# Patient Record
Sex: Male | Born: 1975 | Race: White | Hispanic: No | Marital: Married | Smoking: Never smoker
Health system: Southern US, Community
[De-identification: ages and names within clinical notes are randomized; demographics above are authoritative.]

---

## 2020-04-29 DIAGNOSIS — Z0000 Encounter for general adult medical examination without abnormal findings: Secondary | ICD-10-CM | POA: Diagnosis not present

## 2020-04-29 DIAGNOSIS — Z1329 Encounter for screening for other suspected endocrine disorder: Secondary | ICD-10-CM | POA: Diagnosis not present

## 2020-04-29 DIAGNOSIS — Z13220 Encounter for screening for lipoid disorders: Secondary | ICD-10-CM | POA: Diagnosis not present

## 2020-05-05 DIAGNOSIS — Z0000 Encounter for general adult medical examination without abnormal findings: Secondary | ICD-10-CM | POA: Diagnosis not present

## 2020-05-06 DIAGNOSIS — D3131 Benign neoplasm of right choroid: Secondary | ICD-10-CM | POA: Diagnosis not present

## 2020-05-13 DIAGNOSIS — B078 Other viral warts: Secondary | ICD-10-CM | POA: Diagnosis not present

## 2020-09-10 DIAGNOSIS — D3131 Benign neoplasm of right choroid: Secondary | ICD-10-CM | POA: Diagnosis not present

## 2020-09-30 DIAGNOSIS — Z20822 Contact with and (suspected) exposure to covid-19: Secondary | ICD-10-CM | POA: Diagnosis not present

## 2021-01-03 ENCOUNTER — Other Ambulatory Visit: Payer: Self-pay

## 2021-01-03 ENCOUNTER — Encounter (HOSPITAL_BASED_OUTPATIENT_CLINIC_OR_DEPARTMENT_OTHER): Payer: Self-pay | Admitting: Obstetrics and Gynecology

## 2021-01-03 ENCOUNTER — Emergency Department (HOSPITAL_BASED_OUTPATIENT_CLINIC_OR_DEPARTMENT_OTHER): Payer: BC Managed Care – PPO

## 2021-01-03 ENCOUNTER — Emergency Department (HOSPITAL_BASED_OUTPATIENT_CLINIC_OR_DEPARTMENT_OTHER)
Admission: EM | Admit: 2021-01-03 | Discharge: 2021-01-03 | Disposition: A | Discharge status: Home / Self Care | Payer: BC Managed Care – PPO | Attending: Emergency Medicine | Admitting: Emergency Medicine

## 2021-01-03 DIAGNOSIS — W1839XA Other fall on same level, initial encounter: Secondary | ICD-10-CM | POA: Diagnosis not present

## 2021-01-03 DIAGNOSIS — Y9366 Activity, soccer: Secondary | ICD-10-CM | POA: Diagnosis not present

## 2021-01-03 DIAGNOSIS — S6992XA Unspecified injury of left wrist, hand and finger(s), initial encounter: Secondary | ICD-10-CM | POA: Diagnosis not present

## 2021-01-03 DIAGNOSIS — M79645 Pain in left finger(s): Secondary | ICD-10-CM

## 2021-01-03 NOTE — ED Notes (Signed)
Patient verbalizes understanding of discharge instructions. Opportunity for questioning and answers were provided. Armband removed by staff, pt discharged from ED.  

## 2021-01-03 NOTE — Discharge Instructions (Addendum)
Take 4 over the counter ibuprofen tablets 3 times a day or 2 over-the-counter naproxen tablets twice a day for pain. Also take tylenol 1000mg (2 extra strength) four times a day.   Your x-ray was negative for fracture.  Use the splint for comfort.  Follow-up with orthopedics in the office.

## 2021-01-03 NOTE — ED Provider Notes (Signed)
[facility] EMERGENCY DEPT Provider Note   CSN: [id number] Arrival date & time: 01/03/21  1837     History Chief Complaint  Patient presents with  . Finger Injury    James Nash is a 45 y.o. male.  45 yo M with a chief complaints of left thumb injury.  The patient was playing soccer and he fell onto an outstretched hand and felt like he hyperextended his thumb.  This occurred about 3 hours ago.  Having some worsening pain and swelling mostly to the thenar eminence.  Denies other injury denies pain in the wrist or the elbow.  The history is provided by the patient.  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 No pertinent past medical history.  There are no problems to display for this patient.   History reviewed. No pertinent surgical history.     History reviewed. No pertinent family history.  Social History   Tobacco Use  . Smoking status: Never Smoker  . Smokeless tobacco: Never Used  Vaping Use  . Vaping Use: Never used  Substance Use Topics  . Alcohol use: Yes  . Drug use: Not Currently    Home Medications Prior to Admission medications   Not on File    Allergies    Patient has no allergy information on record.  Review of Systems   Review of Systems  Constitutional: Negative for chills and fever.   HENT: Negative for congestion and facial swelling.   Eyes: Negative for discharge and visual disturbance.  Respiratory: Negative for shortness of breath.   Cardiovascular: Negative for chest pain and palpitations.  Gastrointestinal: Negative for abdominal pain, diarrhea and vomiting.  Musculoskeletal: Positive for arthralgias and myalgias.  Skin: Negative for color change and rash.  Neurological: Negative for tremors, syncope and headaches.  Psychiatric/Behavioral: Negative for confusion and dysphoric mood.    Physical Exam Updated Vital Signs BP 140/87 (BP Location: Right Arm)   Pulse 89   Temp 98.9 F (37.2 C) (Oral)   Resp 15   Ht 5\' 11"  (1.803 m)   Wt 88.5 kg   SpO2 100%   BMI 27.20 kg/m   Physical Exam Vitals and nursing note reviewed.  Constitutional:      Appearance: He is well-developed.  HENT:     Head: Normocephalic and atraumatic.  Eyes:     Pupils: Pupils are equal, round, and reactive to light.  Neck:     Vascular: No JVD.  Cardiovascular:     Rate and Rhythm: Normal rate and regular rhythm.     Heart sounds: No murmur heard. No friction rub. No gallop.   Pulmonary:     Effort: No respiratory distress.     Breath  sounds: No wheezing.  Abdominal:     General: There is no distension.     Tenderness: There is no guarding or rebound.  Musculoskeletal:        General: Swelling and tenderness present. Normal range of motion.     Cervical back: Normal range of motion and neck supple.     Comments: Pain and swelling to the thenar eminence of the left hand.  Pain at the MCP.  Some ligamentous laxity compared to the other side.  Skin:    Coloration: Skin is not pale.     Findings: No rash.  Neurological:     Mental Status: He is alert and oriented to person, place, and time.  Psychiatric:        Behavior: Behavior normal.     ED Results / Procedures / Treatments   Labs (all labs ordered are listed, but only abnormal results are displayed) Labs  Reviewed - No data to display  EKG None  Radiology DG Finger Thumb Left  Result Date: 01/03/2021 CLINICAL DATA:  Acute LEFT thumb pain following injury today. Initial encounter. EXAM: LEFT THUMB 2+V COMPARISON:  None. FINDINGS: There is no evidence of fracture or dislocation. There is no evidence of arthropathy or other focal bone abnormality. Soft tissues are unremarkable. IMPRESSION: Negative. Electronically Signed   By: Harmon Pier M.D.   On: 01/03/2021 20:09    Procedures Procedures   Medications Ordered in ED Medications - No data to display  ED Course  I have reviewed the triage vital signs and the nursing notes.  Pertinent labs & imaging results that were available during my care of the patient were reviewed by me and considered in my medical decision making (see chart for details).    MDM Rules/Calculators/A&P                          45 yo M with a chief complaints of a thumb injury.  Patient had a hyperextension injury after falling while playing soccer.  On exam the patient does have some ligamentous injury making me somewhat concerned for a gamekeeper's thumb.  Will place in a thumb spica.  Plain film viewed by me without fracture.  He is also incidentally febrile here.  Denies any significant symptoms.  10:03 PM:  I have discussed the diagnosis/risks/treatment options with the patient and believe the pt to be eligible for discharge home to follow-up with Hand. We also discussed returning to the ED immediately if new or worsening sx occur. We discussed the sx which are most concerning (e.g., sudden worsening pain, fever, inability to tolerate by mouth) that necessitate immediate return. Medications administered to the patient during their visit and any new prescriptions provided to the patient are listed below.  Medications given during this visit Medications - No data to display   The patient appears reasonably screen and/or stabilized for discharge and I doubt any other  medical condition or other [facility] requiring further screening, evaluation, or treatment in the ED at this time prior to discharge.    Final Clinical Impression(s) / ED Diagnoses Final diagnoses:  Thumb pain, left    Rx / DC Orders ED Discharge Orders    None       Melene Plan, DO 01/03/21 2203

## 2021-01-03 NOTE — ED Triage Notes (Signed)
Patient reports he fell back on his left hand while playing soccer. Reports pain and injury to left thumb

## 2021-05-06 DIAGNOSIS — Z0000 Encounter for general adult medical examination without abnormal findings: Secondary | ICD-10-CM | POA: Diagnosis not present

## 2021-05-13 DIAGNOSIS — E7800 Pure hypercholesterolemia, unspecified: Secondary | ICD-10-CM | POA: Diagnosis not present

## 2021-05-13 DIAGNOSIS — Z23 Encounter for immunization: Secondary | ICD-10-CM | POA: Diagnosis not present

## 2021-05-13 DIAGNOSIS — D229 Melanocytic nevi, unspecified: Secondary | ICD-10-CM | POA: Diagnosis not present

## 2021-05-13 DIAGNOSIS — Z0000 Encounter for general adult medical examination without abnormal findings: Secondary | ICD-10-CM | POA: Diagnosis not present

## 2021-05-14 ENCOUNTER — Other Ambulatory Visit: Payer: Self-pay | Admitting: Internal Medicine

## 2021-05-14 DIAGNOSIS — E7800 Pure hypercholesterolemia, unspecified: Secondary | ICD-10-CM

## 2021-06-11 ENCOUNTER — Ambulatory Visit
Admission: RE | Admit: 2021-06-11 | Discharge: 2021-06-11 | Disposition: A | Discharge status: Home / Self Care | Payer: No Typology Code available for payment source | Source: Ambulatory Visit | Attending: Internal Medicine | Admitting: Internal Medicine

## 2021-06-11 DIAGNOSIS — E785 Hyperlipidemia, unspecified: Secondary | ICD-10-CM | POA: Diagnosis not present

## 2021-06-11 DIAGNOSIS — E7800 Pure hypercholesterolemia, unspecified: Secondary | ICD-10-CM

## 2021-06-11 DIAGNOSIS — Z8249 Family history of ischemic heart disease and other diseases of the circulatory system: Secondary | ICD-10-CM | POA: Diagnosis not present

## 2021-07-01 DIAGNOSIS — E7800 Pure hypercholesterolemia, unspecified: Secondary | ICD-10-CM | POA: Diagnosis not present

## 2021-07-13 DIAGNOSIS — L219 Seborrheic dermatitis, unspecified: Secondary | ICD-10-CM | POA: Diagnosis not present

## 2021-07-13 DIAGNOSIS — Z23 Encounter for immunization: Secondary | ICD-10-CM | POA: Diagnosis not present

## 2021-07-13 DIAGNOSIS — D2272 Melanocytic nevi of left lower limb, including hip: Secondary | ICD-10-CM | POA: Diagnosis not present

## 2021-12-10 DIAGNOSIS — J029 Acute pharyngitis, unspecified: Secondary | ICD-10-CM | POA: Diagnosis not present

## 2022-01-20 DIAGNOSIS — D229 Melanocytic nevi, unspecified: Secondary | ICD-10-CM | POA: Diagnosis not present

## 2022-01-20 DIAGNOSIS — L578 Other skin changes due to chronic exposure to nonionizing radiation: Secondary | ICD-10-CM | POA: Diagnosis not present

## 2022-01-20 DIAGNOSIS — L814 Other melanin hyperpigmentation: Secondary | ICD-10-CM | POA: Diagnosis not present

## 2022-01-20 DIAGNOSIS — D1801 Hemangioma of skin and subcutaneous tissue: Secondary | ICD-10-CM | POA: Diagnosis not present

## 2022-01-26 DIAGNOSIS — R21 Rash and other nonspecific skin eruption: Secondary | ICD-10-CM | POA: Diagnosis not present

## 2022-05-12 DIAGNOSIS — Z0000 Encounter for general adult medical examination without abnormal findings: Secondary | ICD-10-CM | POA: Diagnosis not present

## 2022-05-16 DIAGNOSIS — Z1212 Encounter for screening for malignant neoplasm of rectum: Secondary | ICD-10-CM | POA: Diagnosis not present

## 2022-07-26 ENCOUNTER — Ambulatory Visit (INDEPENDENT_AMBULATORY_CARE_PROVIDER_SITE_OTHER): Payer: BC Managed Care – PPO | Admitting: Family Medicine

## 2022-07-26 ENCOUNTER — Ambulatory Visit (INDEPENDENT_AMBULATORY_CARE_PROVIDER_SITE_OTHER): Payer: BC Managed Care – PPO

## 2022-07-26 ENCOUNTER — Ambulatory Visit: Payer: Self-pay

## 2022-07-26 VITALS — BP 148/88 | HR 82 | Ht 71.0 in | Wt 205.0 lb

## 2022-07-26 DIAGNOSIS — M79672 Pain in left foot: Secondary | ICD-10-CM

## 2022-07-26 NOTE — Patient Instructions (Addendum)
Thank you for coming in today.   I think you have excessive pressure on your bunionette.   Consider wider shoes.   You can cut a slit in your soccer cleats.   Consider aftermarket insole with good arch support and metatarsal pad from [employer] feet.   Also consider the many products online to offload pressure from bunions or bunionettes.   Recheck in about 1 month.   Please get an Xray today before you leave

## 2022-07-26 NOTE — Progress Notes (Signed)
   I, Philbert Riser, LAT, ATC acting as a scribe for Clementeen Graham, MD.  Subjective:    CC: L foot pain  HPI: Pt is a 46 y/o male c/o L foot pain ongoing for 4-5 months. 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 Pt locates pain to along the 5th MT.   L foot swelling: no Aggravates: pressure against the area Treatments tried: new/wider soccer cleats, toe spacers  Pertinent review of Systems: No fevers or chills  Relevant historical information: Otherwise healthy   Objective:    Vitals:   07/26/22 1403  BP: (!) 148/88  Pulse: 82  SpO2: 98%   General: Well Developed, well nourished, and in no acute distress.   MSK: Left foot bunion and bunionette formation.  Some redness at the bunionette.  Mildly tender palpation at the distal lateral fifth metatarsal at bunionette.  Normal foot and ankle motion and strength.  Lab and Radiology Results  Diagnostic Limited MSK Ultrasound of: Left lateral foot The distal fifth metatarsal was visualized with ultrasound and the bony cortex was normal-appearing with no evidence of stress fracture. No significant hypoechoic fluid collections or other significant abnormalities are visualized. Impression: Normal-appearing MSK ultrasound examination of the distal fifth metatarsal   X-ray images left foot obtained today personally and independently interpreted No severe abnormality left foot.  Mild bunion and bunionette formation is present.  No severe DJD or acute fractures visible. Await formal radiology review  Impression and Recommendations:    Assessment and Plan: 46 y.o. male with excessive pressure at bunionette causing pain.  Recommend pressure offloading techniques.  Reviewed a couple options cleaning different or new shoes.  Lacing pattern, bunionette shields.  Also recommend supportive insoles with metatarsal pads.  Check back in about a month.Marland Kitchen  PDMP not reviewed this encounter. Orders Placed This  Encounter  Procedures   DG Foot Complete Left    Standing Status:   Future    Number of Occurrences:   1    Standing Expiration Date:   07/27/2023    Order Specific Question:   Reason for Exam (SYMPTOM  OR DIAGNOSIS REQUIRED)    Answer:   left foot pain    Order Specific Question:   Preferred imaging location?    Answer:   [facility]   [place] LIMITED JOINT SPACE STRUCTURES LOW LEFT(NO LINKED CHARGES)    Order Specific Question:   Reason for Exam (SYMPTOM  OR DIAGNOSIS REQUIRED)    Answer:   left foot pain    Order Specific Question:   Preferred imaging location?    Answer:   [facility]   No orders of the defined types were placed in this encounter.   Discussed warning signs or symptoms. Please see discharge instructions. Patient expresses understanding.   The above documentation has been reviewed and is accurate and complete Clementeen Graham, M.D.

## 2022-07-27 NOTE — Progress Notes (Signed)
Left foot x-ray shows a little bit of ankle arthritis

## 2022-08-23 DIAGNOSIS — Z0110 Encounter for examination of ears and hearing without abnormal findings: Secondary | ICD-10-CM | POA: Diagnosis not present

## 2022-08-23 DIAGNOSIS — H93293 Other abnormal auditory perceptions, bilateral: Secondary | ICD-10-CM | POA: Diagnosis not present

## 2022-08-26 ENCOUNTER — Other Ambulatory Visit (HOSPITAL_BASED_OUTPATIENT_CLINIC_OR_DEPARTMENT_OTHER): Payer: Self-pay

## 2022-08-26 MED ORDER — COMIRNATY 30 MCG/0.3ML IM SUSY
PREFILLED_SYRINGE | INTRAMUSCULAR | 0 refills | Status: AC
  Filled 2022-08-26: qty 0.3, 1d supply, fill #0

## 2022-08-26 MED ORDER — FLUARIX QUADRIVALENT 0.5 ML IM SUSY
PREFILLED_SYRINGE | INTRAMUSCULAR | 0 refills | Status: AC
  Filled 2022-08-26: qty 0.5, 1d supply, fill #0

## 2022-09-07 ENCOUNTER — Ambulatory Visit: Payer: BC Managed Care – PPO | Admitting: Family Medicine

## 2022-09-07 VITALS — BP 150/86 | Ht 71.0 in | Wt 206.0 lb

## 2022-09-07 DIAGNOSIS — M79672 Pain in left foot: Secondary | ICD-10-CM | POA: Diagnosis not present

## 2022-09-07 NOTE — Progress Notes (Unsigned)
   I, Peterson Lombard, LAT, ATC acting as a scribe for Lynne Leader, MD.  James Nash is a 47 y.o. male who presents to [facility] today for f/u L foot pain. 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  Pt was last seen by Dr. Georgina Snell on 07/26/22 and was advised to offload pressure and supportive insoles w/ MT pads. Today, pt reports L foot pain is somewhat improved. He notes pain w/ different footwear. Pt is not using the MT pads or insoles, but has bought some wider shoes.   Dx imaging: 07/26/22 L foot XR  Pertinent review of systems: no fever or chills  Relevant historical information: Otherwise healthy   Exam:  BP (!) 150/86   Ht 5\' 11"  (1.803 m)   Wt 206 lb (93.4 kg)   BMI 28.73 kg/m  General: Well Developed, well nourished, and in no acute distress.   MSK: Left foot: Small bunionette.  Nontender normal foot and ankle motion.    Lab and Radiology Results EXAM: LEFT FOOT - COMPLETE 3+ VIEW   COMPARISON:  None Available.   FINDINGS: Mild distal anterior tibial plafond and adjacent dorsal talar neck degenerative osteophytosis. Joint spaces are preserved. No acute fracture or dislocation.   IMPRESSION: Minimal anterior tibiotalar osteoarthritis. No significant osteoarthritis of the left foot.     Electronically Signed   By: Yvonne Kendall M.D.   On: 07/26/2022 18:20 I, Lynne Leader, personally (independently) visualized and performed the interpretation of the images attached in this note.      Assessment and Plan: 47 y.o. male with pain at left bunionette.  This is improving with slightly larger wider shoes.  A metatarsal pad could also help.  He is doing okay now.  His symptoms are bothersome enough to treat more aggressively.  Plan for watchful waiting and potential further modifications as needed if his pain worsens as his activity level increases in the spring.  Check back as needed.   Total encounter  time 20 minutes including face-to-face time with the patient and, reviewing past medical record, and charting on the date of service.     Discussed warning signs or symptoms. Please see discharge instructions. Patient expresses understanding.   The above documentation has been reviewed and is accurate and complete Lynne Leader, M.D.

## 2022-09-07 NOTE — Patient Instructions (Signed)
Thank you for coming in today.   Consider metatarsal pads built into insoles. [employer] has them.   Recheck as needed.

## 2022-10-07 ENCOUNTER — Other Ambulatory Visit: Payer: Self-pay

## 2023-01-24 DIAGNOSIS — L578 Other skin changes due to chronic exposure to nonionizing radiation: Secondary | ICD-10-CM | POA: Diagnosis not present

## 2023-01-24 DIAGNOSIS — D229 Melanocytic nevi, unspecified: Secondary | ICD-10-CM | POA: Diagnosis not present

## 2023-05-18 DIAGNOSIS — Z23 Encounter for immunization: Secondary | ICD-10-CM | POA: Diagnosis not present

## 2023-05-18 DIAGNOSIS — Z0000 Encounter for general adult medical examination without abnormal findings: Secondary | ICD-10-CM | POA: Diagnosis not present

## 2023-05-22 DIAGNOSIS — F909 Attention-deficit hyperactivity disorder, unspecified type: Secondary | ICD-10-CM | POA: Diagnosis not present

## 2023-05-22 DIAGNOSIS — E7800 Pure hypercholesterolemia, unspecified: Secondary | ICD-10-CM | POA: Diagnosis not present

## 2023-05-22 DIAGNOSIS — Z23 Encounter for immunization: Secondary | ICD-10-CM | POA: Diagnosis not present

## 2023-05-22 DIAGNOSIS — M7061 Trochanteric bursitis, right hip: Secondary | ICD-10-CM | POA: Diagnosis not present

## 2023-05-22 DIAGNOSIS — Z0000 Encounter for general adult medical examination without abnormal findings: Secondary | ICD-10-CM | POA: Diagnosis not present

## 2023-05-22 DIAGNOSIS — D229 Melanocytic nevi, unspecified: Secondary | ICD-10-CM | POA: Diagnosis not present

## 2023-07-04 DIAGNOSIS — Z3009 Encounter for other general counseling and advice on contraception: Secondary | ICD-10-CM | POA: Diagnosis not present

## 2023-08-09 ENCOUNTER — Other Ambulatory Visit (HOSPITAL_BASED_OUTPATIENT_CLINIC_OR_DEPARTMENT_OTHER): Payer: Self-pay

## 2023-08-09 DIAGNOSIS — Z1211 Encounter for screening for malignant neoplasm of colon: Secondary | ICD-10-CM | POA: Diagnosis not present

## 2023-08-09 MED ORDER — POLYETHYLENE GLYCOL 3350 17 GM/SCOOP PO POWD
ORAL | 0 refills | Status: AC
  Filled 2023-08-09 – 2023-09-12 (×2): qty 238, 1d supply, fill #0

## 2023-08-21 ENCOUNTER — Other Ambulatory Visit (HOSPITAL_BASED_OUTPATIENT_CLINIC_OR_DEPARTMENT_OTHER): Payer: Self-pay

## 2023-08-28 IMAGING — CT CT CARDIAC CORONARY ARTERY CALCIUM SCORE
3 series · 14 of 20 positions shown, 16 images · non-contrast
Comparison: None.

CLINICAL DATA: 45-year-old Caucasian male with history of
hyperlipidemia and family history of heart disease.

EXAM:
CT CARDIAC CORONARY ARTERY CALCIUM SCORE
TECHNIQUE: Non-contrast imaging through the heart was performed using
prospective ECG gating. Image post processing was performed on an
independent workstation, allowing for quantitative analysis of the
heart and coronary arteries. Note that this exam targets the heart
and the chest was not imaged in its entirety.

[Series 2: calcium scoring 2.00 qr36 bestdiast 70% hrt calciu · axial · 0.49mm/px · z∈[+1665,+1743]mm · 4 of 67 slices shown]
[im 14/67  vessel]
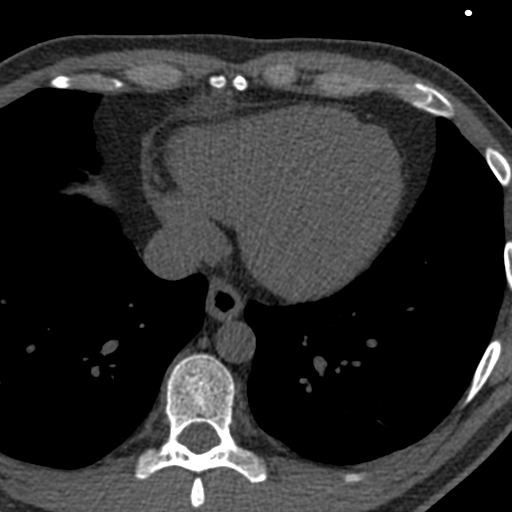
[im 27/67  vessel]
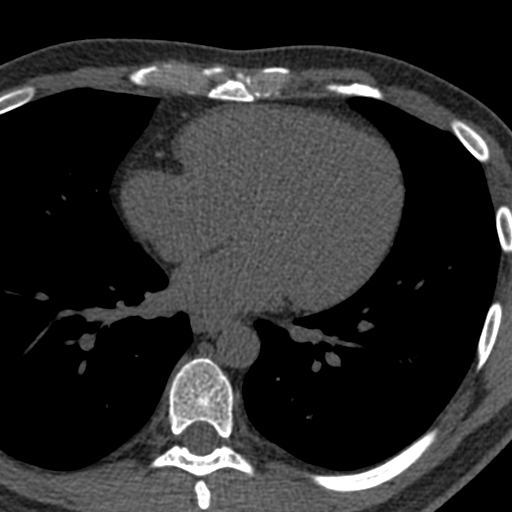
[im 40/67  vessel]
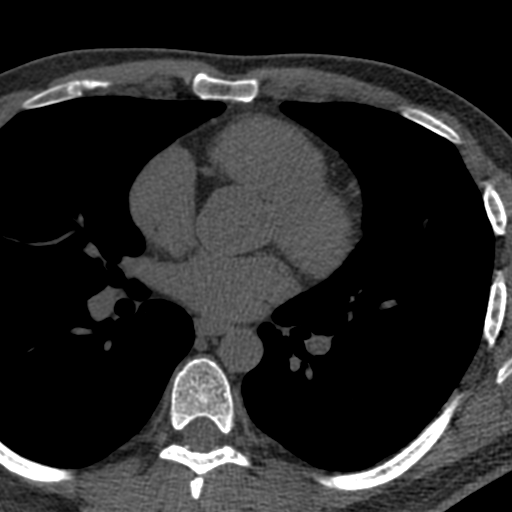
[im 53/67  vessel]
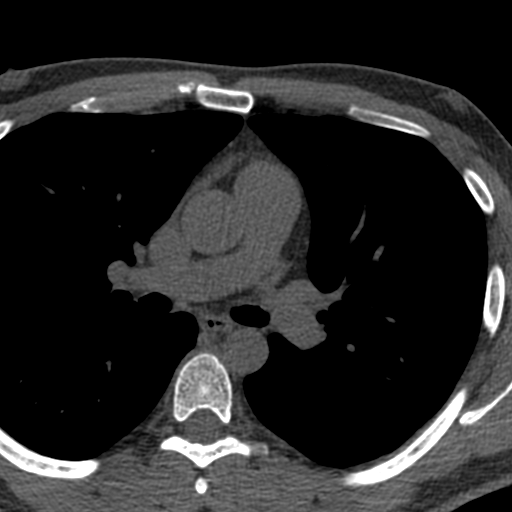

[Series 3: calcium scoring 2.00 br40 bestdiast 70% axial · axial · 0.56mm/px · z∈[+1661,+1751]mm · 5 of 69 slices shown, 7 images]
[im 12/69  vessel]
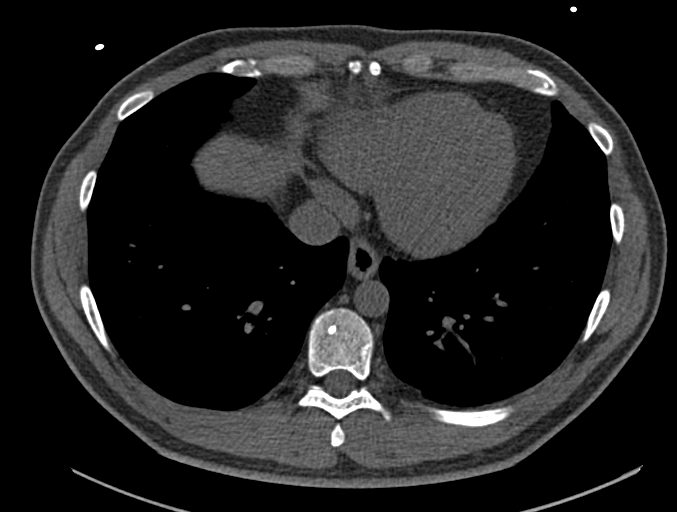
[im 12/69  lung]
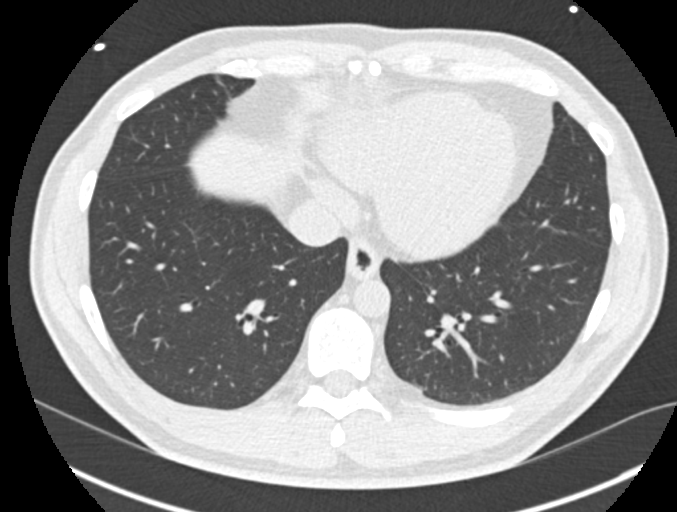
[im 23/69  vessel]
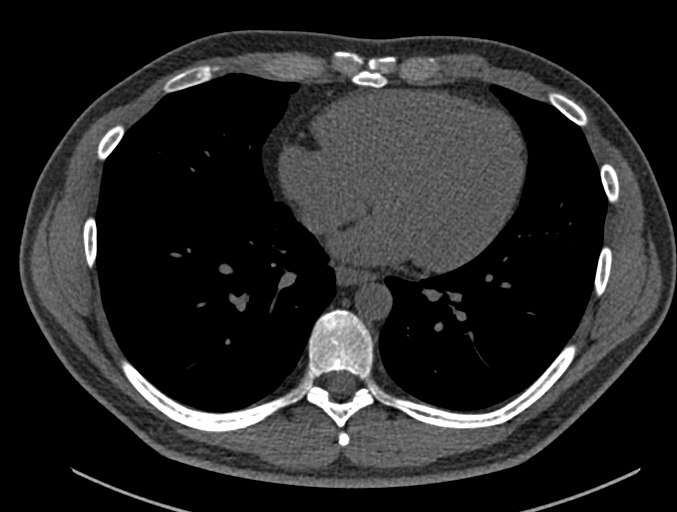
[im 35/69  vessel]
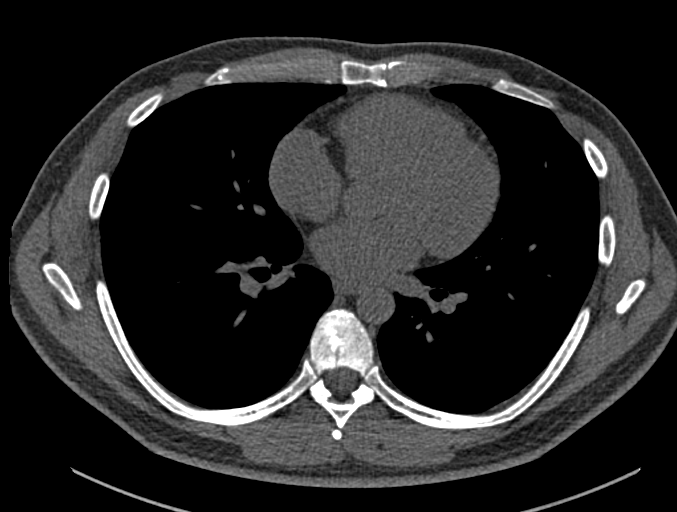
[im 46/69  vessel]
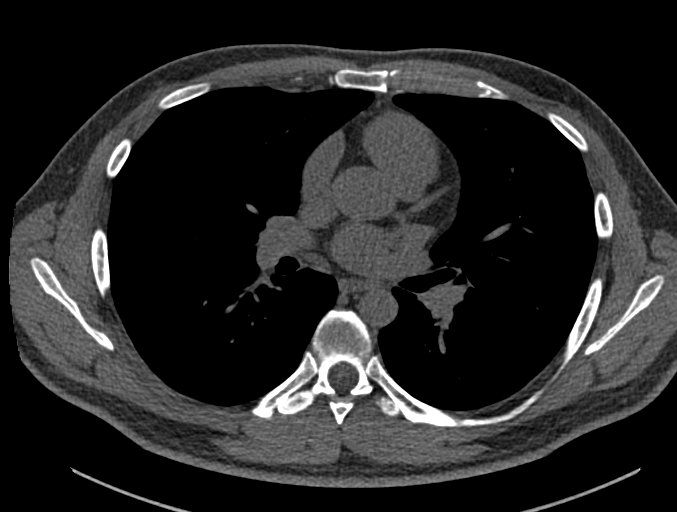
[im 57/69  vessel]
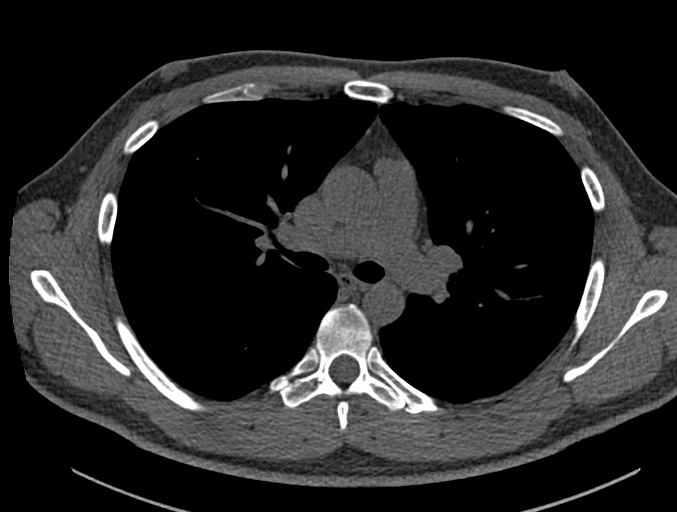
[im 57/69  lung]
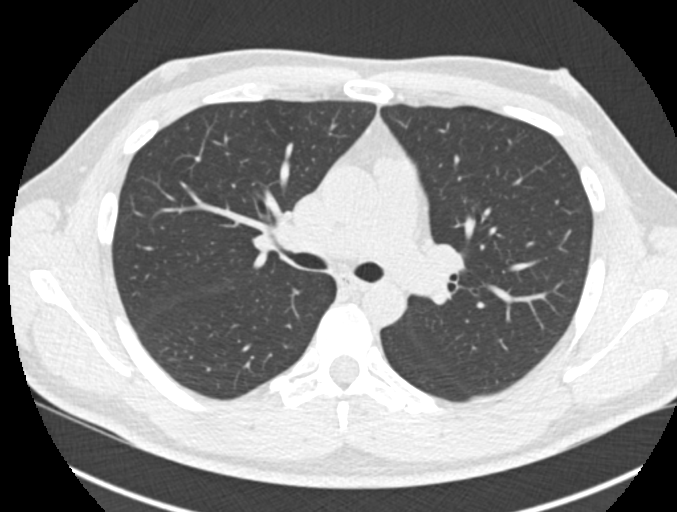

[Series 9: calcium scoring 2.00 br60 bestdiast 70% lungs · axial · 0.56mm/px · z∈[+1661,+1751]mm · 5 of 69 slices shown]
[im 12/69  vessel]
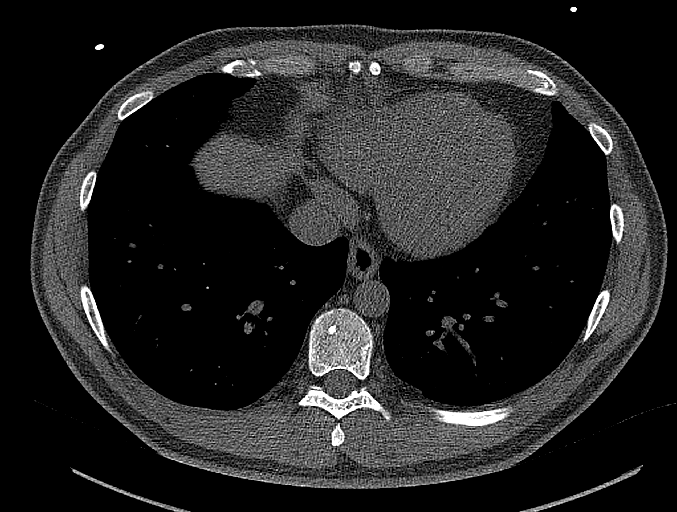
[im 23/69  vessel]
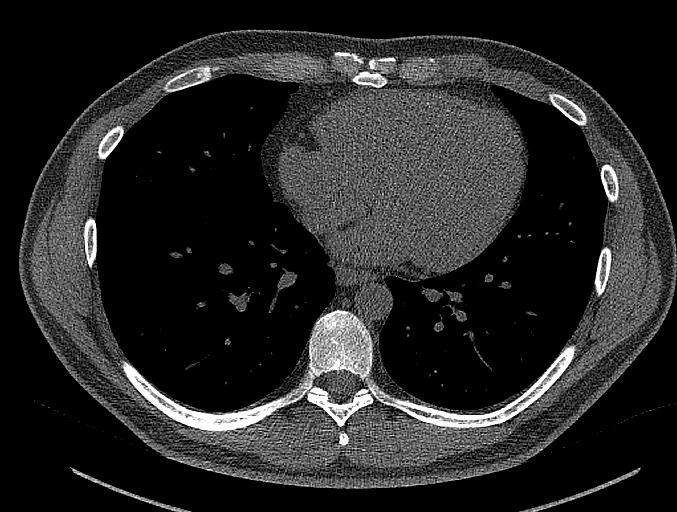
[im 35/69  vessel]
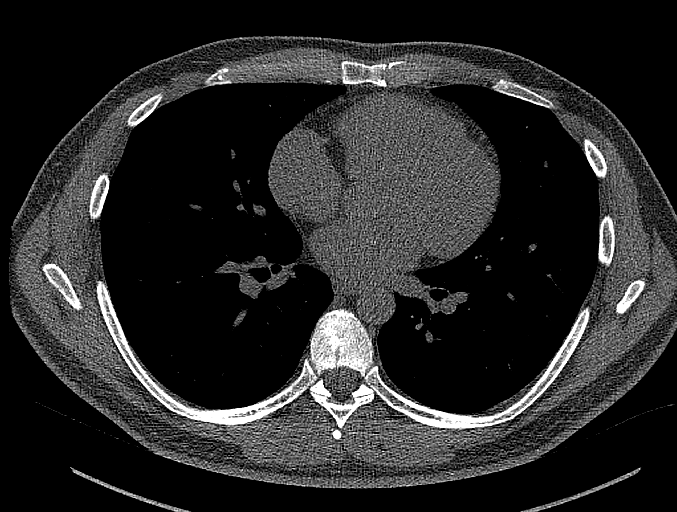
[im 46/69  vessel]
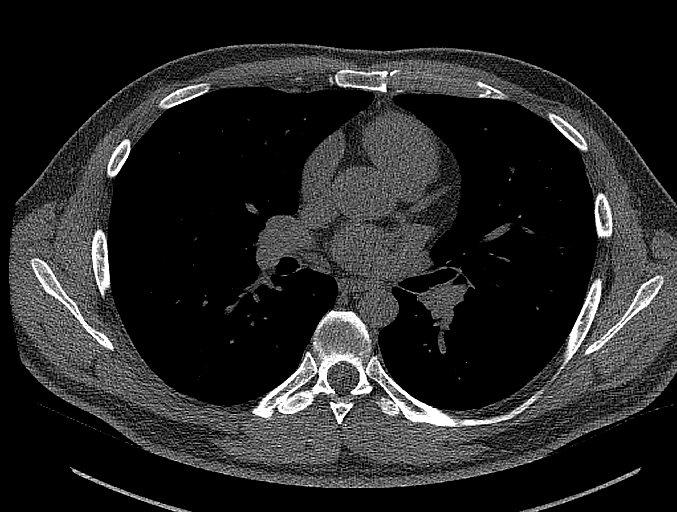
[im 57/69  vessel]
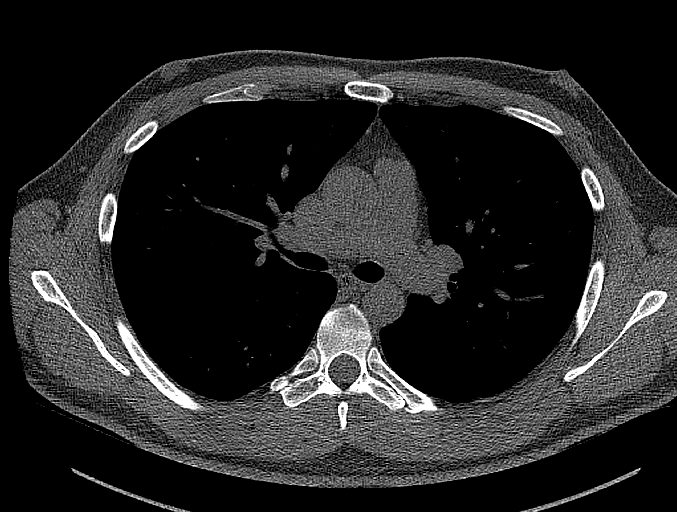

[14 of 20 positions shown; findings below may reference images not displayed]

FINDINGS: CORONARY CALCIUM SCORES:

Left Main: 0

LAD: 0

LCx: 0

RCA: 0

Total Agatston Score: 0

[HOSPITAL] percentile: 0

AORTA MEASUREMENTS:

Ascending Aorta: 31 mm

Descending Aorta: 21 mm

OTHER FINDINGS:


## 2023-09-12 ENCOUNTER — Other Ambulatory Visit (HOSPITAL_BASED_OUTPATIENT_CLINIC_OR_DEPARTMENT_OTHER): Payer: Self-pay

## 2023-09-29 DIAGNOSIS — Z1211 Encounter for screening for malignant neoplasm of colon: Secondary | ICD-10-CM | POA: Diagnosis not present

## 2023-10-09 DIAGNOSIS — K635 Polyp of colon: Secondary | ICD-10-CM | POA: Diagnosis not present

## 2023-12-07 DIAGNOSIS — K6289 Other specified diseases of anus and rectum: Secondary | ICD-10-CM | POA: Diagnosis not present

## 2023-12-07 DIAGNOSIS — D126 Benign neoplasm of colon, unspecified: Secondary | ICD-10-CM | POA: Diagnosis not present

## 2023-12-07 DIAGNOSIS — K5730 Diverticulosis of large intestine without perforation or abscess without bleeding: Secondary | ICD-10-CM | POA: Diagnosis not present

## 2023-12-07 DIAGNOSIS — K648 Other hemorrhoids: Secondary | ICD-10-CM | POA: Diagnosis not present

## 2023-12-07 DIAGNOSIS — Z6827 Body mass index (BMI) 27.0-27.9, adult: Secondary | ICD-10-CM | POA: Diagnosis not present

## 2023-12-07 DIAGNOSIS — R030 Elevated blood-pressure reading, without diagnosis of hypertension: Secondary | ICD-10-CM | POA: Diagnosis not present

## 2024-01-11 DIAGNOSIS — Z302 Encounter for sterilization: Secondary | ICD-10-CM | POA: Diagnosis not present

## 2024-02-19 DIAGNOSIS — L578 Other skin changes due to chronic exposure to nonionizing radiation: Secondary | ICD-10-CM | POA: Diagnosis not present

## 2024-02-19 DIAGNOSIS — L821 Other seborrheic keratosis: Secondary | ICD-10-CM | POA: Diagnosis not present

## 2024-02-19 DIAGNOSIS — L814 Other melanin hyperpigmentation: Secondary | ICD-10-CM | POA: Diagnosis not present

## 2024-02-19 DIAGNOSIS — D229 Melanocytic nevi, unspecified: Secondary | ICD-10-CM | POA: Diagnosis not present

## 2024-05-20 DIAGNOSIS — Z23 Encounter for immunization: Secondary | ICD-10-CM | POA: Diagnosis not present

## 2024-05-20 DIAGNOSIS — Z0000 Encounter for general adult medical examination without abnormal findings: Secondary | ICD-10-CM | POA: Diagnosis not present

## 2024-05-27 DIAGNOSIS — Z0000 Encounter for general adult medical examination without abnormal findings: Secondary | ICD-10-CM | POA: Diagnosis not present

## 2024-05-27 DIAGNOSIS — M9261 Juvenile osteochondrosis of tarsus, right ankle: Secondary | ICD-10-CM | POA: Diagnosis not present

## 2024-05-27 DIAGNOSIS — M9262 Juvenile osteochondrosis of tarsus, left ankle: Secondary | ICD-10-CM | POA: Diagnosis not present

## 2024-05-27 DIAGNOSIS — E7800 Pure hypercholesterolemia, unspecified: Secondary | ICD-10-CM | POA: Diagnosis not present

## 2024-05-27 DIAGNOSIS — Z23 Encounter for immunization: Secondary | ICD-10-CM | POA: Diagnosis not present

## 2024-06-05 ENCOUNTER — Ambulatory Visit (INDEPENDENT_AMBULATORY_CARE_PROVIDER_SITE_OTHER)

## 2024-06-05 ENCOUNTER — Other Ambulatory Visit (HOSPITAL_BASED_OUTPATIENT_CLINIC_OR_DEPARTMENT_OTHER): Payer: Self-pay

## 2024-06-05 ENCOUNTER — Encounter: Payer: Self-pay | Admitting: Podiatry

## 2024-06-05 ENCOUNTER — Ambulatory Visit: Admitting: Podiatry

## 2024-06-05 DIAGNOSIS — M7752 Other enthesopathy of left foot: Secondary | ICD-10-CM

## 2024-06-05 DIAGNOSIS — M7751 Other enthesopathy of right foot: Secondary | ICD-10-CM | POA: Diagnosis not present

## 2024-06-05 DIAGNOSIS — M21622 Bunionette of left foot: Secondary | ICD-10-CM | POA: Diagnosis not present

## 2024-06-05 MED ORDER — MELOXICAM 15 MG PO TABS
15.0000 mg | ORAL_TABLET | Freq: Every day | ORAL | 0 refills | Status: AC | PRN
  Filled 2024-06-05: qty 30, 30d supply, fill #0

## 2024-06-05 NOTE — Patient Instructions (Signed)
 You can use VOLTAREN GEL on the area as well if not taking the oral anti-inflammatory.   Achilles Tendinitis  with Rehab Achilles tendinitis is a disorder of the Achilles tendon. The Achilles tendon connects the large calf muscles (Gastrocnemius and Soleus) to the heel bone (calcaneus). This tendon is sometimes called the heel cord. It is important for pushing-off and standing on your toes and is important for walking, running, or jumping. Tendinitis is often caused by overuse and repetitive microtrauma. SYMPTOMS Pain, tenderness, swelling, warmth, and redness may occur over the Achilles tendon even at rest. Pain with pushing off, or flexing or extending the ankle. Pain that is worsened after or during activity. CAUSES  Overuse sometimes seen with rapid increase in exercise programs or in sports requiring running and jumping. Poor physical conditioning (strength and flexibility or endurance). Running sports, especially training running down hills. Inadequate warm-up before practice or play or failure to stretch before participation. Injury to the tendon. PREVENTION  Warm up and stretch before practice or competition. Allow time for adequate rest and recovery between practices and competition. Keep up conditioning. Keep up ankle and leg flexibility. Improve or keep muscle strength and endurance. Improve cardiovascular fitness. Use proper technique. Use proper equipment (shoes, skates). To help prevent recurrence, taping, protective strapping, or an adhesive bandage may be recommended for several weeks after healing is complete. PROGNOSIS  Recovery may take weeks to several months to heal. Longer recovery is expected if symptoms have been prolonged. Recovery is usually quicker if the inflammation is due to a direct blow as compared with overuse or sudden strain. RELATED COMPLICATIONS  Healing time will be prolonged if the condition is not correctly treated. The injury must be given plenty  of time to heal. Symptoms can reoccur if activity is resumed too soon. Untreated, tendinitis may increase the risk of tendon rupture requiring additional time for recovery and possibly surgery. TREATMENT  The first treatment consists of rest anti-inflammatory medication, and ice to relieve the pain. Stretching and strengthening exercises after resolution of pain will likely help reduce the risk of recurrence. Referral to a physical therapist or athletic trainer for further evaluation and treatment may be helpful. A walking boot or cast may be recommended to rest the Achilles tendon. This can help break the cycle of inflammation and microtrauma. Arch supports (orthotics) may be prescribed or recommended by your caregiver as an adjunct to therapy and rest. Surgery to remove the inflamed tendon lining or degenerated tendon tissue is rarely necessary and has shown less than predictable results. MEDICATION  Nonsteroidal anti-inflammatory medications, such as aspirin and ibuprofen, may be used for pain and inflammation relief. Do not take within 7 days before surgery. Take these as directed by your caregiver. Contact your caregiver immediately if any bleeding, stomach upset, or signs of allergic reaction occur. Other minor pain relievers, such as acetaminophen, may also be used. Pain relievers may be prescribed as necessary by your caregiver. Do not take prescription pain medication for longer than 4 to 7 days. Use only as directed and only as much as you need. Cortisone injections are rarely indicated. Cortisone injections may weaken tendons and predispose to rupture. It is better to give the condition more time to heal than to use them. HEAT AND COLD Cold is used to relieve pain and reduce inflammation for acute and chronic Achilles tendinitis. Cold should be applied for 10 to 15 minutes every 2 to 3 hours for inflammation and pain and immediately after any activity  that aggravates your symptoms. Use ice  packs or an ice massage. Heat may be used before performing stretching and strengthening activities prescribed by your caregiver. Use a heat pack or a warm soak. SEEK MEDICAL CARE IF: Symptoms get worse or do not improve in 2 weeks despite treatment. New, unexplained symptoms develop. Drugs used in treatment may produce side effects.  EXERCISES:  RANGE OF MOTION (ROM) AND STRETCHING EXERCISES - Achilles Tendinitis  These exercises may help you when beginning to rehabilitate your injury. Your symptoms may resolve with or without further involvement from your physician, physical therapist or athletic trainer. While completing these exercises, remember:  Restoring tissue flexibility helps normal motion to return to the joints. This allows healthier, less painful movement and activity. An effective stretch should be held for at least 30 seconds. A stretch should never be painful. You should only feel a gentle lengthening or release in the stretched tissue.  STRETCH  Gastroc, Standing  Place hands on wall. Extend right / left leg, keeping the front knee somewhat bent. Slightly point your toes inward on your back foot. Keeping your right / left heel on the floor and your knee straight, shift your weight toward the wall, not allowing your back to arch. You should feel a gentle stretch in the right / left calf. Hold this position for 10 seconds. Repeat 3 times. Complete this stretch 2 times per day.  STRETCH  Soleus, Standing  Place hands on wall. Extend right / left leg, keeping the other knee somewhat bent. Slightly point your toes inward on your back foot. Keep your right / left heel on the floor, bend your back knee, and slightly shift your weight over the back leg so that you feel a gentle stretch deep in your back calf. Hold this position for 10 seconds. Repeat 3 times. Complete this stretch 2 times per day.  STRETCH  Gastrocsoleus, Standing  Note: This exercise can place a lot of stress  on your foot and ankle. Please complete this exercise only if specifically instructed by your caregiver.  Place the ball of your right / left foot on a step, keeping your other foot firmly on the same step. Hold on to the wall or a rail for balance. Slowly lift your other foot, allowing your body weight to press your heel down over the edge of the step. You should feel a stretch in your right / left calf. Hold this position for 10 seconds. Repeat this exercise with a slight bend in your knee. Repeat 3 times. Complete this stretch 2 times per day.   STRENGTHENING EXERCISES - Achilles Tendinitis These exercises may help you when beginning to rehabilitate your injury. They may resolve your symptoms with or without further involvement from your physician, physical therapist or athletic trainer. While completing these exercises, remember:  Muscles can gain both the endurance and the strength needed for everyday activities through controlled exercises. Complete these exercises as instructed by your physician, physical therapist or athletic trainer. Progress the resistance and repetitions only as guided. You may experience muscle soreness or fatigue, but the pain or discomfort you are trying to eliminate should never worsen during these exercises. If this pain does worsen, stop and make certain you are following the directions exactly. If the pain is still present after adjustments, discontinue the exercise until you can discuss the trouble with your clinician.  STRENGTH - Plantar-flexors  Sit with your right / left leg extended. Holding onto both ends of a rubber  exercise band/tubing, loop it around the ball of your foot. Keep a slight tension in the band. Slowly push your toes away from you, pointing them downward. Hold this position for 10 seconds. Return slowly, controlling the tension in the band/tubing. Repeat 3 times. Complete this exercise 2 times per day.   STRENGTH - Plantar-flexors  Stand  with your feet shoulder width apart. Steady yourself with a wall or table using as little support as needed. Keeping your weight evenly spread over the width of your feet, rise up on your toes.* Hold this position for 10 seconds. Repeat 3 times. Complete this exercise 2 times per day.  *If this is too easy, shift your weight toward your right / left leg until you feel challenged. Ultimately, you may be asked to do this exercise with your right / left foot only.  STRENGTH  Plantar-flexors, Eccentric  Note: This exercise can place a lot of stress on your foot and ankle. Please complete this exercise only if specifically instructed by your caregiver.  Place the balls of your feet on a step. With your hands, use only enough support from a wall or rail to keep your balance. Keep your knees straight and rise up on your toes. Slowly shift your weight entirely to your right / left toes and pick up your opposite foot. Gently and with controlled movement, lower your weight through your right / left foot so that your heel drops below the level of the step. You will feel a slight stretch in the back of your calf at the end position. Use the healthy leg to help rise up onto the balls of both feet, then lower weight only on the right / left leg again. Build up to 15 repetitions. Then progress to 3 consecutive sets of 15 repetitions.* After completing the above exercise, complete the same exercise with a slight knee bend (about 30 degrees). Again, build up to 15 repetitions. Then progress to 3 consecutive sets of 15 repetitions.* Perform this exercise 2 times per day.  *When you easily complete 3 sets of 15, your physician, physical therapist or athletic trainer may advise you to add resistance by wearing a backpack filled with additional weight.  STRENGTH - Plantar Flexors, Seated  Sit on a chair that allows your feet to rest flat on the ground. If necessary, sit at the edge of the chair. Keeping your toes  firmly on the ground, lift your right / left heel as far as you can without increasing any discomfort in your ankle. Repeat 3 times. Complete this exercise 2 times a day.

## 2024-06-05 NOTE — Progress Notes (Signed)
 Subjective:  Patient ID: James Nash, male    DOB: [birth year],  MRN: [id number]  Chief Complaint  Patient presents with   Heel Spurs    Patient is here for bilateral spurs right foot worse than left    Discussed the use of AI scribe software for clinical note transcription with the patient, who gave verbal consent to proceed.  History of Present Illness James Nash is a 48 year old male who presents with heel pain and a bump on the back of his right heel.  He experiences heel pain, more pronounced on the right side, for the past three to four months. The pain worsens after playing soccer, particularly due to the tightness of his soccer shoes. A bump on the back of his right heel has remained the same size since he first observed it. Protective sleeves and pads have been used to alleviate discomfort, leading to some improvement in pain.  No recent injuries to his feet have occurred. He has a history of tight calf muscles and has been informed of tight hamstrings, which he believes may be connected to his current issues. Larger shoes have been purchased to address discomfort on the outside of both feet, which has improved.      Objective:  There were no vitals filed for this visit.  Physical Exam General: AAO x3, NAD  Dermatological: Skin is warm, dry and supple bilateral. There are no open sores, no preulcerative lesions, no rash or signs of infection present.  Vascular: Dorsalis Pedis artery and Posterior Tibial artery pedal pulses are 2/4 bilateral with immedate capillary fill time.  There is no pain with calf compression, swelling, warmth, erythema.   Neruologic: Grossly intact via light touch bilateral.   Musculoskeletal: There is tenderness palpation of the distal portion of the Achilles tendon along the insertion into the calcaneus and prominent heel spur is noted.  There is no pain with lateral compression of the calcaneus.  Equinus is noted.  No deficit noted along  the Achilles tendon.  On the left foot mild tailor's bunion is present with localized erythema, inflammation.  There is no open lesions or signs of infection otherwise.  Gait: Unassisted, Nonantalgic.     No images are attached to the encounter.    Results RADIOLOGY Foot X-ray: Heel spur on the posterior aspect of the calcaneus, more prominent on the right side. Tailor's bunion on the left foot with angulation of the fifth metatarsal.   Assessment:   1. Bone spur of right foot   2. Bone spur of left foot   3. Tailor's bunionette, left      Plan:  Patient was evaluated and treated and all questions answered.  Assessment and Plan Assessment & Plan Heel spur with associated pain, right greater than left, related to tight calf muscles Chronic heel spur pain, more pronounced on the right side, linked to tight calf muscles and Achilles tendon tension. Managed by reducing inflammation and pressure. Surgery considered for severe cases. - Prescribed meloxicam for pain and inflammation. - Recommended Voltaren gel for topical anti-inflammatory use. - Advised icing to reduce inflammation. - Provided calf muscle stretching exercises. - Recommended shoes with good arch support, avoid flat shoes. - Discussed surgery for severe cases, not advised currently.  Tailor's bunion, left foot Tailor's bunion causing discomfort due to shoe rubbing, related to foot structure. Surgery considered for severe cases. - Recommended wider shoes to reduce pressure. - Provided pad for bunion protection in narrow shoes. - Discussed  surgical options if symptoms persist.   No follow-ups on file.   Donnice JONELLE Fees DPM

## 2024-06-17 ENCOUNTER — Other Ambulatory Visit (HOSPITAL_BASED_OUTPATIENT_CLINIC_OR_DEPARTMENT_OTHER): Payer: Self-pay
